# Patient Record
Sex: Male | Born: 1985 | Race: White | Hispanic: No | Marital: Married | Smoking: Never smoker
Health system: Southern US, Community
[De-identification: ages and names within clinical notes are randomized; demographics above are authoritative.]

---
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
## 2006-07-16 IMAGING — CR DG ANKLE COMPLETE 3+V*L*
3 series · 3 of 3 positions shown · non-contrast
Comparison: none

CLINICAL DATA: Lateral left ankle pain following a twisting injury today.
 COMPLETE LEFT ANKLE ? 03/17/04 
 Three views of the left ankle demonstrate lateral soft tissue swelling.  No fracture, dislocation, or effusion.
 IMPRESSION
 No fracture.

[view not recorded (1 of 3)]
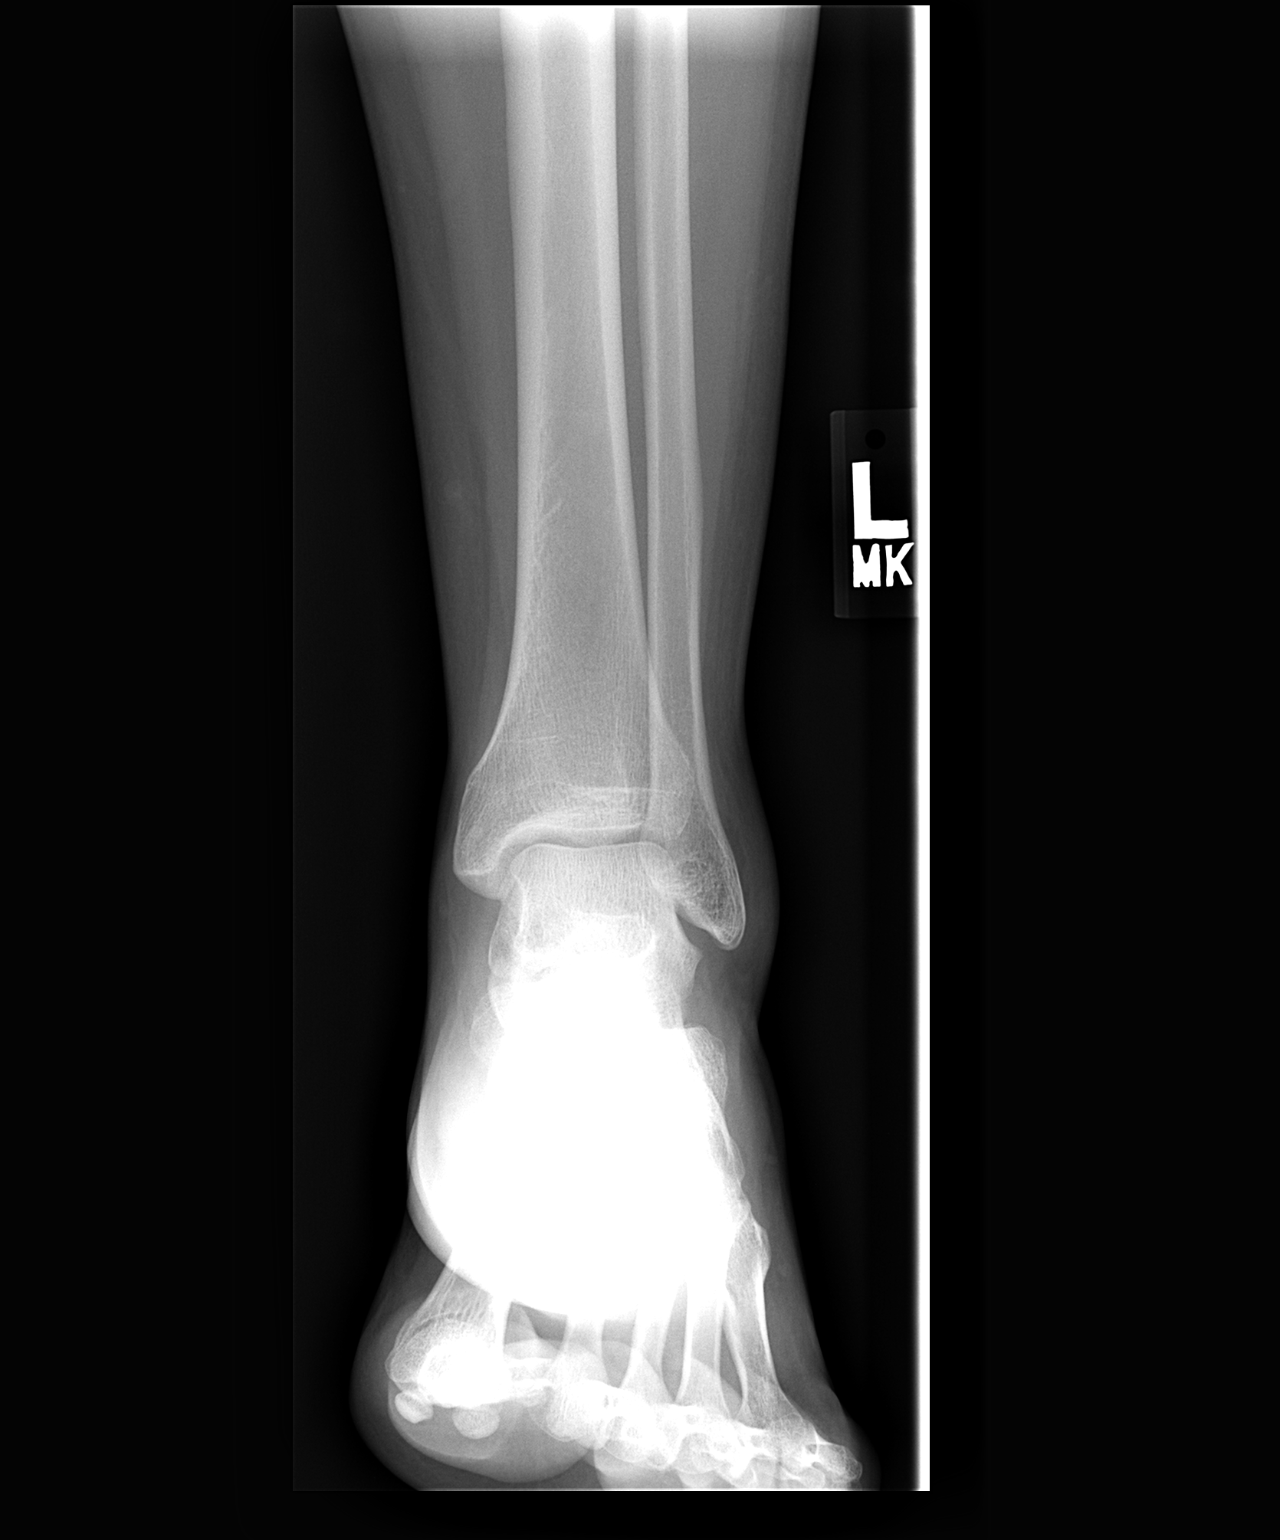

[view not recorded (2 of 3)]
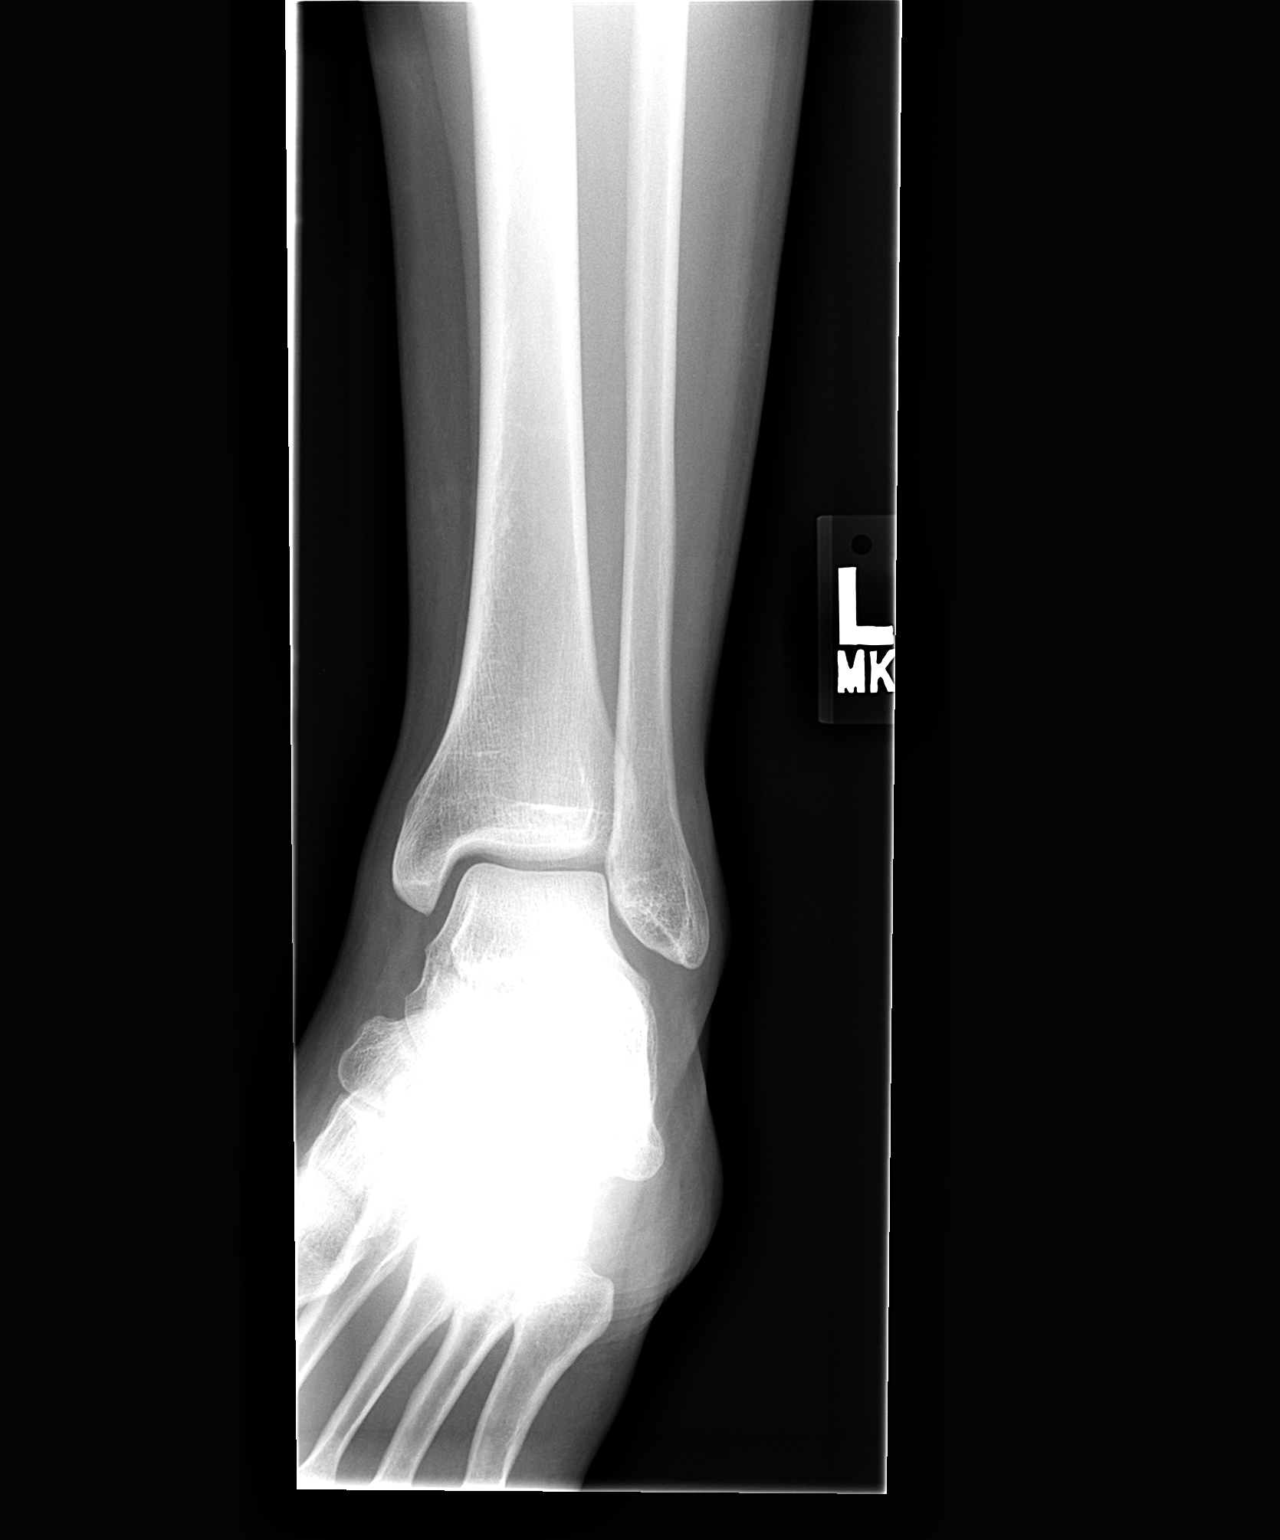

[view not recorded (3 of 3)]
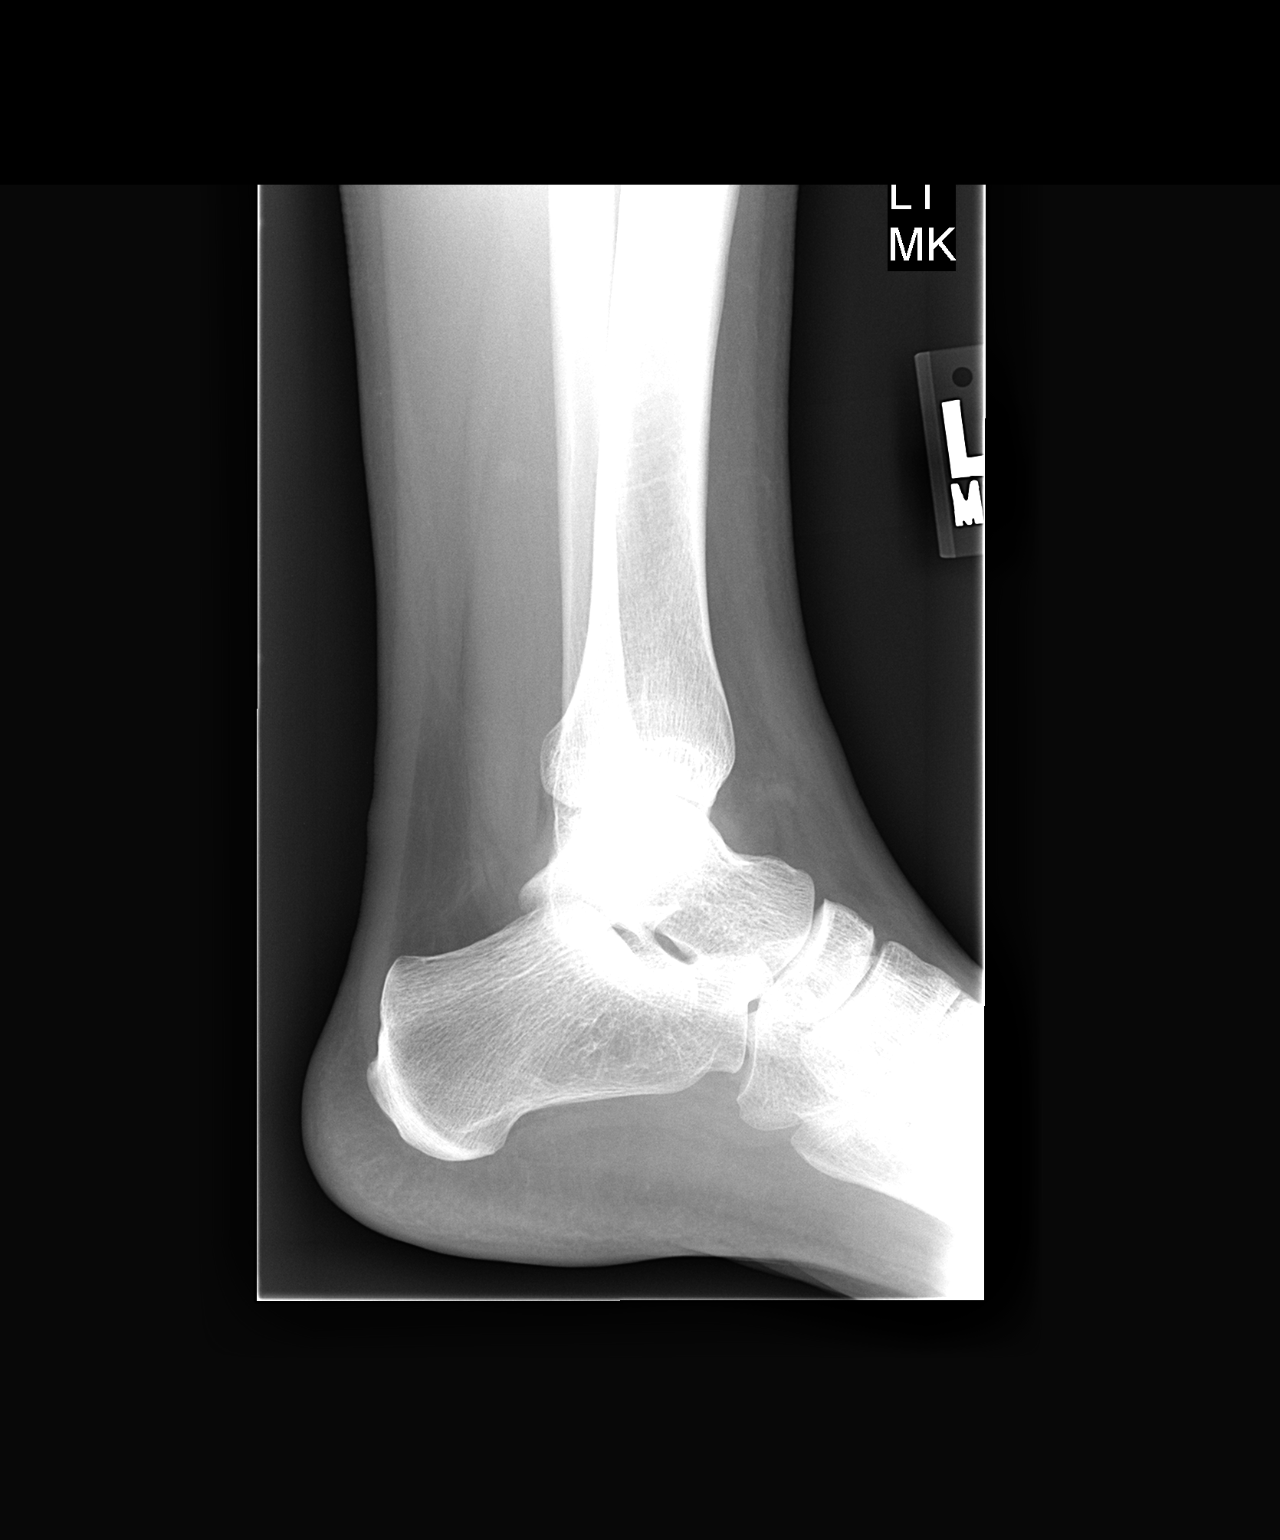

[3 of 3 positions shown; findings below may reference images not displayed]

## 2014-06-16 ENCOUNTER — Emergency Department: Payer: Self-pay | Admitting: Emergency Medicine

## 2015-12-09 ENCOUNTER — Emergency Department
Admission: EM | Admit: 2015-12-09 | Discharge: 2015-12-09 | Disposition: A | Discharge status: Home / Self Care | Payer: BC Managed Care – PPO | Attending: Emergency Medicine | Admitting: Emergency Medicine

## 2015-12-09 ENCOUNTER — Emergency Department
Admission: EM | Admit: 2015-12-09 | Discharge: 2015-12-09 | Disposition: A | Discharge status: Other Acute Inpatient Hospital | Payer: BC Managed Care – PPO

## 2015-12-09 ENCOUNTER — Encounter: Payer: Self-pay | Admitting: Emergency Medicine

## 2015-12-09 DIAGNOSIS — R300 Dysuria: Secondary | ICD-10-CM | POA: Insufficient documentation

## 2015-12-09 LAB — URINALYSIS COMPLETE WITH MICROSCOPIC (ARMC ONLY)
BACTERIA UA: NONE SEEN
Bilirubin Urine: NEGATIVE
GLUCOSE, UA: NEGATIVE mg/dL
HGB URINE DIPSTICK: NEGATIVE
Ketones, ur: NEGATIVE mg/dL
Leukocytes, UA: NEGATIVE
Nitrite: NEGATIVE
PROTEIN: NEGATIVE mg/dL
RBC / HPF: NONE SEEN RBC/hpf (ref 0–5)
SQUAMOUS EPITHELIAL / LPF: NONE SEEN
Specific Gravity, Urine: 1.012 (ref 1.005–1.030)
pH: 6 (ref 5.0–8.0)

## 2015-12-09 LAB — CHLAMYDIA/NGC RT PCR (ARMC ONLY)
CHLAMYDIA TR: NOT DETECTED
N gonorrhoeae: NOT DETECTED

## 2015-12-09 NOTE — ED Provider Notes (Signed)
[facility] Emergency Department Provider Note  ____________________________________________  Time seen: Approximately 1:07 PM  I have reviewed the triage vital signs and the nursing notes.   HISTORY  Chief Complaint Dysuria   HPI Joshua Velez is a 30 y.o. male is here with complaint of dysuria and frequency for the last 3 days. Patient denies any nausea, vomiting or fever. He denies any back pain or prior kidney stones. He also denies any penile discharge and states that he does not have more than one partner and they're not having any difficulty or symptoms. Patient gives a history of having a "UTI" when he was possibly 14 or 15. He states that this went away with antibiotics and he has not had any problems since. He did not have a urology consult at that time. He states the symptoms are very similar to what he is experiencing now. Denies any change in urinary color but does go more often than usual. He denies any knowledge of prostate problems. Patient relates this is not painful but rates his pain as a 2/10.   History reviewed. No pertinent past medical history.  There are no active problems to display for this patient.   History reviewed. No pertinent past surgical history.  No current outpatient prescriptions on file.  Allergies Review of patient's allergies indicates no known allergies.  History reviewed. No pertinent family history.  Social History Social History  Substance Use Topics  . Smoking status: Never Smoker   . Smokeless tobacco: None  . Alcohol Use: No    Review of Systems Constitutional: No fever/chills Gastrointestinal: No abdominal pain.  No nausea, no vomiting.  Genitourinary: Positive for dysuria. Musculoskeletal: Negative for back pain. Skin: Negative for rash. Neurological: Negative for headaches, focal weakness or numbness.  10-point ROS otherwise negative.  ____________________________________________   PHYSICAL  EXAM:  VITAL SIGNS: ED Triage Vitals  Enc Vitals Group     BP 12/09/15 1247 128/76 mmHg     Pulse Rate 12/09/15 1247 72     Resp 12/09/15 1247 18     Temp 12/09/15 1247 98 F (36.7 C)     Temp Source 12/09/15 1247 Oral     SpO2 12/09/15 1247 98 %     Weight 12/09/15 1247 176 lb (79.833 kg)     Height 12/09/15 1247 5\' 4"  (1.626 m)     Head Cir --      Peak Flow --      Pain Score 12/09/15 1239 2     Pain Loc --      Pain Edu? --      Excl. in GC? --     Constitutional: Alert and oriented. Well appearing and in no acute distress. Eyes: Conjunctivae are normal. PERRL. EOMI. Head: Atraumatic. Nose: No congestion/rhinnorhea. Neck: No stridor.   Cardiovascular: Normal rate, regular rhythm. Grossly normal heart sounds.  Good peripheral circulation. Respiratory: Normal respiratory effort.  No retractions. Lungs CTAB. Gastrointestinal: Soft and nontender. No distention. Genitourinary: Rectal exam was done. There were no hemorrhoids or lesions externally. On digital exam there is no tenderness or enlargement of the prostate appreciated. Hemoccult slide was negative. Musculoskeletal: Moves upper and lower extremities without any difficulty. Normal gait was noted. Neurologic:  Normal speech and language. No gross focal neurologic deficits are appreciated. No gait instability. Skin:  Skin is warm, dry and intact. No rash noted. Psychiatric: Mood and affect are normal. Speech and behavior are normal.  ____________________________________________   LABS (all labs  ordered are listed, but only abnormal results are displayed)  Labs Reviewed  URINALYSIS COMPLETEWITH MICROSCOPIC ([facility] ONLY) - Abnormal; Notable for the following:    Color, Urine YELLOW (*)    APPearance CLEAR (*)    All other components within normal limits  CHLAMYDIA/NGC RT PCR ([facility] ONLY)    PROCEDURES  Procedure(s) performed: None  Critical Care performed:  No  ____________________________________________   INITIAL IMPRESSION / ASSESSMENT AND PLAN / ED COURSE  Pertinent labs & imaging results that were available during my care of the patient were reviewed by me and considered in my medical decision making (see chart for details).  GC and chlamydia culture were submitted but at this time patient will not be treated until results had been obtained. Patient was also given the name of the urologist on call to follow-up with. ____________________________________________   FINAL CLINICAL IMPRESSION(S) / ED DIAGNOSES  Final diagnoses:  Dysuria      Tommi Rumps, PA-C 12/09/15 1353  Rockne Menghini, MD 12/09/15 [phone]

## 2015-12-09 NOTE — ED Notes (Signed)
Pt to ed with c/o burning and frequency of urine x 3 days.

## 2015-12-09 NOTE — ED Notes (Signed)
See triage note. States he noticed some burning with urination and freq couple of days ago  deneis any fever or penile discharge

## 2015-12-09 NOTE — Discharge Instructions (Signed)
Dysuria Dysuria is pain or discomfort while urinating. The pain or discomfort may be felt in the tube that carries urine out of the bladder (urethra) or in the surrounding tissue of the genitals. The pain may also be felt in the groin area, lower abdomen, and lower back. You may have to urinate frequently or have the sudden feeling that you have to urinate (urgency). Dysuria can affect both men and women, but is more common in women. Dysuria can be caused by many different things, including:  Urinary tract infection in women.  Infection of the kidney or bladder.  Kidney stones or bladder stones.  Certain sexually transmitted infections (STIs), such as chlamydia.  Dehydration.  Inflammation of the vagina.  Use of certain medicines.  Use of certain soaps or scented products that cause irritation. HOME CARE INSTRUCTIONS Watch your dysuria for any changes. The following actions may help to reduce any discomfort you are feeling:  Drink enough fluid to keep your urine clear or pale yellow.  Empty your bladder often. Avoid holding urine for long periods of time.  After a bowel movement or urination, women should cleanse from front to back, using each tissue only once.  Empty your bladder after sexual intercourse.  Take medicines only as directed by your health care provider.  If you were prescribed an antibiotic medicine, finish it all even if you start to feel better.  Avoid caffeine, tea, and alcohol. They can irritate the bladder and make dysuria worse. In men, alcohol may irritate the prostate.  Keep all follow-up visits as directed by your health care provider. This is important.  If you had any tests done to find the cause of dysuria, it is your responsibility to obtain your test results. Ask the lab or department performing the test when and how you will get your results. Talk with your health care provider if you have any questions about your results. SEEK MEDICAL CARE  IF:  You develop pain in your back or sides.  You have a fever.  You have nausea or vomiting.  You have blood in your urine.  You are not urinating as often as you usually do. SEEK IMMEDIATE MEDICAL CARE IF:  You pain is severe and not relieved with medicines.  You are unable to hold down any fluids.  You or someone else notices a change in your mental function.  You have a rapid heartbeat at rest.  You have shaking or chills.  You feel extremely weak.   This information is not intended to replace advice given to you by your health care provider. Make sure you discuss any questions you have with your health care provider.   Document Released: 05/15/2004 Document Revised: 09/07/2014 Document Reviewed: 04/12/2014 Elsevier Interactive Patient Education [employer]2016 [employer]   A culture was submitted for your urine today and you'll find out if this is positive by getting a phone call from the emergency room. Follow-up with urologist written on your papers for continued evaluation of your symptoms. Increase fluids. Avoid caffeine at present.

## 2018-02-22 ENCOUNTER — Emergency Department
Admission: EM | Admit: 2018-02-22 | Discharge: 2018-02-22 | Disposition: A | Discharge status: Home / Self Care | Payer: BC Managed Care – PPO | Attending: Emergency Medicine | Admitting: Emergency Medicine

## 2018-02-22 ENCOUNTER — Encounter: Payer: Self-pay | Admitting: Emergency Medicine

## 2018-02-22 DIAGNOSIS — J321 Chronic frontal sinusitis: Secondary | ICD-10-CM | POA: Diagnosis not present

## 2018-02-22 DIAGNOSIS — H66001 Acute suppurative otitis media without spontaneous rupture of ear drum, right ear: Secondary | ICD-10-CM | POA: Insufficient documentation

## 2018-02-22 DIAGNOSIS — H9201 Otalgia, right ear: Secondary | ICD-10-CM | POA: Diagnosis present

## 2018-02-22 MED ORDER — AMOXICILLIN 875 MG PO TABS: 875.0000 mg | ORAL_TABLET | Freq: Two times a day (BID) | ORAL | 0 refills | Status: AC

## 2018-02-22 MED ORDER — AMOXICILLIN 500 MG PO CAPS
1000.0000 mg | ORAL_CAPSULE | Freq: Once | ORAL | Status: AC
  Administered 2018-02-22: 1000 mg via ORAL
  Filled 2018-02-22: qty 2

## 2018-02-22 MED ORDER — FLUTICASONE PROPIONATE 50 MCG/ACT NA SUSP: 1.0000 | Freq: Two times a day (BID) | NASAL | 0 refills | Status: AC

## 2018-02-22 NOTE — ED Triage Notes (Signed)
Pt c/o right ear pain x3 days. Pt denies drainage and fever.

## 2018-02-22 NOTE — ED Provider Notes (Signed)
[facility] Emergency Department Provider Note  ____________________________________________  Time seen: Approximately 10:03 PM  I have reviewed the triage vital signs and the nursing notes.   HISTORY  Chief Complaint Otalgia    HPI Joshua Velez is a 32 y.o. male who presents the emergency department complaining of right ear pain.  Patient reports that he has a history of chronic sinusitis and typically when allergy season is at its peak he will typically have an ear infection as well.  Patient reports that he is been taking Claritin but no other medicines for his allergies.  He reports sharp right ear pain.  Patient denies swelling or drainage from his ear.  He denies any sinus pressure or headache.  No fevers or chills, sore throat.  No other complaints at this time.  Other than Claritin, no medications for this complaint.    History reviewed. No pertinent past medical history.  There are no active problems to display for this patient.   History reviewed. No pertinent surgical history.  Prior to Admission medications   Medication Sig Start Date End Date Taking? Authorizing Provider  amoxicillin (AMOXIL) 875 MG tablet Take 1 tablet (875 mg total) by mouth 2 (two) times daily. 02/22/18   Theador Jezewski, Delorise RoyalsJonathan D, PA-C  fluticasone (FLONASE) 50 MCG/ACT nasal spray Place 1 spray into both nostrils 2 (two) times daily. 02/22/18   Dhana Totton, Delorise RoyalsJonathan D, PA-C    Allergies Patient has no known allergies.  History reviewed. No pertinent family history.  Social History Social History   Tobacco Use  . Smoking status: Never Smoker  . Smokeless tobacco: Never Used  Substance Use Topics  . Alcohol use: No  . Drug use: No     Review of Systems  Constitutional: No fever/chills Eyes: No visual changes. No discharge ENT: Chronic sinusitis with right ear pain Cardiovascular: no chest pain. Respiratory: no cough. No SOB. Gastrointestinal: No abdominal pain.  No  nausea, no vomiting.  Musculoskeletal: Negative for musculoskeletal pain. Skin: Negative for rash, abrasions, lacerations, ecchymosis. Neurological: Negative for headaches, focal weakness or numbness. 10-point ROS otherwise negative.  ____________________________________________   PHYSICAL EXAM:  VITAL SIGNS: ED Triage Vitals  Enc Vitals Group     BP 02/22/18 2027 (!) 148/91     Pulse Rate 02/22/18 2027 94     Resp 02/22/18 2027 18     Temp 02/22/18 2027 98.8 F (37.1 C)     Temp Source 02/22/18 2027 Oral     SpO2 02/22/18 2027 96 %     Weight 02/22/18 2026 176 lb (79.8 kg)     Height --      Head Circumference --      Peak Flow --      Pain Score 02/22/18 2155 8     Pain Loc --      Pain Edu? --      Excl. in GC? --      Constitutional: Alert and oriented. Well appearing and in no acute distress. Eyes: Conjunctivae are normal. PERRL. EOMI. Head: Atraumatic. ENT:      Ears: EACs unremarkable bilaterally.  TM on left is unremarkable.  TM on right is injected, bulging, mucoid air-fluid level.      Nose: Mild congestion/rhinnorhea.  Turbinates are boggy.      Mouth/Throat: Mucous membranes are moist.  Neck: No stridor.   Hematological/Lymphatic/Immunilogical: No cervical lymphadenopathy. Cardiovascular: Normal rate, regular rhythm. Normal S1 and S2.  Good peripheral circulation. Respiratory: Normal respiratory effort without  tachypnea or retractions. Lungs CTAB. Good air entry to the bases with no decreased or absent breath sounds. Musculoskeletal: Full range of motion to all extremities. No gross deformities appreciated. Neurologic:  Normal speech and language. No gross focal neurologic deficits are appreciated.  Skin:  Skin is warm, dry and intact. No rash noted. Psychiatric: Mood and affect are normal. Speech and behavior are normal. Patient exhibits appropriate insight and judgement.   ____________________________________________   LABS (all labs ordered are  listed, but only abnormal results are displayed)  Labs Reviewed - No data to display ____________________________________________  EKG   ____________________________________________  RADIOLOGY   No results found.  ____________________________________________    PROCEDURES  Procedure(s) performed:    Procedures    Medications  amoxicillin (AMOXIL) capsule 1,000 mg (has no administration in time range)     ____________________________________________   INITIAL IMPRESSION / ASSESSMENT AND PLAN / ED COURSE  Pertinent labs & imaging results that were available during my care of the patient were reviewed by me and considered in my medical decision making (see chart for details).  Review of the [place] CSRS was performed in accordance of the [facility] prior to dispensing any controlled drugs.      Patient's diagnosis is consistent with current sinusitis and right-sided otitis media.  Patient presents the emergency department complaining of right ear pain.  Patient has chronic sinusitis and reports he typically experiences an ear infection at least once a year.  Exam reveals findings consistent with otitis media to the right side.  Differential included otitis externa, otitis media, eustachian tube dysfunction, bacterial sinusitis, allergic rhinitis, strep.. Patient will be discharged home with prescriptions for amoxicillin and Flonase. Patient is to follow up with primary care as needed or otherwise directed. Patient is given ED precautions to return to the ED for any worsening or new symptoms.     ____________________________________________  FINAL CLINICAL IMPRESSION(S) / ED DIAGNOSES  Final diagnoses:  Non-recurrent acute suppurative otitis media of right ear without spontaneous rupture of tympanic membrane  Chronic frontal sinusitis      NEW MEDICATIONS STARTED DURING THIS VISIT:  ED Discharge Orders        Ordered    amoxicillin (AMOXIL) 875 MG tablet  2 times  daily     02/22/18 2209    fluticasone (FLONASE) 50 MCG/ACT nasal spray  2 times daily     02/22/18 2209          This chart was dictated using voice recognition software/Dragon. Despite best efforts to proofread, errors can occur which can change the meaning. Any change was purely unintentional.    Racheal Patches, PA-C 02/22/18 2210    Jeanmarie Plant, MD 02/22/18 [phone]

## 2019-11-20 ENCOUNTER — Other Ambulatory Visit: Payer: Self-pay

## 2019-11-20 DIAGNOSIS — Z0283 Encounter for blood-alcohol and blood-drug test: Secondary | ICD-10-CM

## 2019-11-20 NOTE — Progress Notes (Signed)
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 Specimen collected using [employer] Chain of Custody form for [facility] account number [id number]. Specimen ID [id number]  AMD
